# Patient Record
Sex: Male | Born: 1995 | Race: Black or African American | Hispanic: No | Marital: Single | Smoking: Never smoker
Health system: Southern US, Community
[De-identification: ages and names within clinical notes are randomized; demographics above are authoritative.]

## PROBLEM LIST (undated history)

## (undated) ENCOUNTER — Emergency Department (HOSPITAL_BASED_OUTPATIENT_CLINIC_OR_DEPARTMENT_OTHER): Admission: EM | Source: Home / Self Care

## (undated) HISTORY — PX: FACIAL FRACTURE SURGERY: SHX1570

---
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
## 2019-06-01 ENCOUNTER — Emergency Department (HOSPITAL_BASED_OUTPATIENT_CLINIC_OR_DEPARTMENT_OTHER)
Admission: EM | Admit: 2019-06-01 | Discharge: 2019-06-01 | Disposition: A | Discharge status: Home / Self Care | Payer: No Typology Code available for payment source | Attending: Emergency Medicine | Admitting: Emergency Medicine

## 2019-06-01 ENCOUNTER — Encounter (HOSPITAL_BASED_OUTPATIENT_CLINIC_OR_DEPARTMENT_OTHER): Payer: Self-pay | Admitting: *Deleted

## 2019-06-01 ENCOUNTER — Emergency Department (HOSPITAL_BASED_OUTPATIENT_CLINIC_OR_DEPARTMENT_OTHER): Payer: No Typology Code available for payment source

## 2019-06-01 DIAGNOSIS — M542 Cervicalgia: Secondary | ICD-10-CM | POA: Diagnosis not present

## 2019-06-01 DIAGNOSIS — M79606 Pain in leg, unspecified: Secondary | ICD-10-CM | POA: Insufficient documentation

## 2019-06-01 DIAGNOSIS — Y9389 Activity, other specified: Secondary | ICD-10-CM | POA: Diagnosis not present

## 2019-06-01 DIAGNOSIS — M25519 Pain in unspecified shoulder: Secondary | ICD-10-CM | POA: Insufficient documentation

## 2019-06-01 DIAGNOSIS — Y92410 Unspecified street and highway as the place of occurrence of the external cause: Secondary | ICD-10-CM | POA: Diagnosis not present

## 2019-06-01 DIAGNOSIS — S0990XA Unspecified injury of head, initial encounter: Secondary | ICD-10-CM | POA: Diagnosis present

## 2019-06-01 DIAGNOSIS — Y999 Unspecified external cause status: Secondary | ICD-10-CM | POA: Insufficient documentation

## 2019-06-01 MED ORDER — HYDROCODONE-ACETAMINOPHEN 5-325 MG PO TABS
1.0000 | ORAL_TABLET | Freq: Once | ORAL | Status: AC
  Administered 2019-06-01: 23:00:00 1 via ORAL
  Filled 2019-06-01: qty 1

## 2019-06-01 NOTE — Discharge Instructions (Addendum)
You were evaluated in the Emergency Department and after careful evaluation, we did not find any emergent condition requiring admission or further testing in the hospital.  Your exam/testing today is overall reassuring.  CT scans today were normal, no injuries.  Please return to the Emergency Department if you experience any worsening of your condition.  We encourage you to follow up with a primary care provider.  Thank you for allowing [place] to be a part of your care.
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
## 2019-09-06 ENCOUNTER — Emergency Department (HOSPITAL_COMMUNITY): Payer: No Typology Code available for payment source

## 2019-09-06 ENCOUNTER — Inpatient Hospital Stay (HOSPITAL_COMMUNITY)
Admission: EM | Admit: 2019-09-06 | Discharge: 2019-09-08 | DRG: 494 | Disposition: A | Discharge status: Home Health | Payer: No Typology Code available for payment source | Attending: Orthopaedic Surgery | Admitting: Orthopaedic Surgery

## 2019-09-06 ENCOUNTER — Other Ambulatory Visit: Payer: Self-pay

## 2019-09-06 DIAGNOSIS — S82892A Other fracture of left lower leg, initial encounter for closed fracture: Secondary | ICD-10-CM | POA: Diagnosis not present

## 2019-09-06 DIAGNOSIS — S9002XA Contusion of left ankle, initial encounter: Secondary | ICD-10-CM

## 2019-09-06 DIAGNOSIS — S82892B Other fracture of left lower leg, initial encounter for open fracture type I or II: Principal | ICD-10-CM | POA: Diagnosis present

## 2019-09-06 DIAGNOSIS — S91012A Laceration without foreign body, left ankle, initial encounter: Secondary | ICD-10-CM | POA: Diagnosis present

## 2019-09-06 DIAGNOSIS — M25372 Other instability, left ankle: Secondary | ICD-10-CM | POA: Diagnosis present

## 2019-09-06 DIAGNOSIS — S93432A Sprain of tibiofibular ligament of left ankle, initial encounter: Secondary | ICD-10-CM | POA: Diagnosis present

## 2019-09-06 DIAGNOSIS — S42115A Nondisplaced fracture of body of scapula, left shoulder, initial encounter for closed fracture: Secondary | ICD-10-CM | POA: Diagnosis present

## 2019-09-06 DIAGNOSIS — T300 Burn of unspecified body region, unspecified degree: Secondary | ICD-10-CM

## 2019-09-06 DIAGNOSIS — Z20822 Contact with and (suspected) exposure to covid-19: Secondary | ICD-10-CM | POA: Diagnosis present

## 2019-09-06 DIAGNOSIS — S9032XA Contusion of left foot, initial encounter: Secondary | ICD-10-CM | POA: Diagnosis present

## 2019-09-06 DIAGNOSIS — S40812A Abrasion of left upper arm, initial encounter: Secondary | ICD-10-CM | POA: Diagnosis present

## 2019-09-06 DIAGNOSIS — S8262XB Displaced fracture of lateral malleolus of left fibula, initial encounter for open fracture type I or II: Secondary | ICD-10-CM | POA: Diagnosis present

## 2019-09-06 DIAGNOSIS — R21 Rash and other nonspecific skin eruption: Secondary | ICD-10-CM | POA: Diagnosis present

## 2019-09-06 LAB — URINALYSIS, ROUTINE W REFLEX MICROSCOPIC
Bacteria, UA: NONE SEEN
Bilirubin Urine: NEGATIVE
Glucose, UA: NEGATIVE mg/dL
Ketones, ur: NEGATIVE mg/dL
Leukocytes,Ua: NEGATIVE
Nitrite: NEGATIVE
Protein, ur: NEGATIVE mg/dL
Specific Gravity, Urine: >1.046 — ABNORMAL HIGH (ref 1.005–1.030)
pH: 5 (ref 5.0–8.0)

## 2019-09-06 LAB — LACTIC ACID, PLASMA: Lactic Acid, Venous: 3.4 mmol/L (ref 0.5–1.9)

## 2019-09-06 LAB — I-STAT CHEM 8, ED
BUN: 16 mg/dL (ref 6–20)
Calcium, Ion: 1.07 mmol/L — ABNORMAL LOW (ref 1.15–1.40)
Chloride: 100 mmol/L (ref 98–111)
Creatinine, Ser: 1.1 mg/dL (ref 0.61–1.24)
Glucose, Bld: 115 mg/dL — ABNORMAL HIGH (ref 70–99)
HCT: 49 % (ref 39.0–52.0)
Hemoglobin: 16.7 g/dL (ref 13.0–17.0)
Potassium: 5.9 mmol/L — ABNORMAL HIGH (ref 3.5–5.1)
Sodium: 138 mmol/L (ref 135–145)
TCO2: 28 mmol/L (ref 22–32)

## 2019-09-06 LAB — BASIC METABOLIC PANEL
Anion gap: 12 (ref 5–15)
BUN: 12 mg/dL (ref 6–20)
CO2: 24 mmol/L (ref 22–32)
Calcium: 9.2 mg/dL (ref 8.9–10.3)
Chloride: 104 mmol/L (ref 98–111)
Creatinine, Ser: 1.04 mg/dL (ref 0.61–1.24)
GFR calc Af Amer: >60 mL/min (ref >60)
GFR calc non Af Amer: >60 mL/min (ref >60)
Glucose, Bld: 119 mg/dL — ABNORMAL HIGH (ref 70–99)
Potassium: 3.5 mmol/L (ref 3.5–5.1)
Sodium: 140 mmol/L (ref 135–145)

## 2019-09-06 LAB — PROTIME-INR
INR: 1 (ref 0.8–1.2)
Prothrombin Time: 12.8 seconds (ref 11.4–15.2)

## 2019-09-06 LAB — CBC
HCT: 44.9 % (ref 39.0–52.0)
Hemoglobin: 15.2 g/dL (ref 13.0–17.0)
MCH: 30 pg (ref 26.0–34.0)
MCHC: 33.9 g/dL (ref 30.0–36.0)
MCV: 88.7 fL (ref 80.0–100.0)
Platelets: 189 10*3/uL (ref 150–400)
RBC: 5.06 MIL/uL (ref 4.22–5.81)
RDW: 12.5 % (ref 11.5–15.5)
WBC: 18 10*3/uL — ABNORMAL HIGH (ref 4.0–10.5)
nRBC: 0 % (ref 0.0–0.2)

## 2019-09-06 LAB — ETHANOL: Alcohol, Ethyl (B): <10 mg/dL (ref <10)

## 2019-09-06 LAB — SAMPLE TO BLOOD BANK

## 2019-09-06 MED ORDER — TETANUS-DIPHTH-ACELL PERTUSSIS 5-2.5-18.5 LF-MCG/0.5 IM SUSP
0.5000 mL | Freq: Once | INTRAMUSCULAR | Status: AC
  Administered 2019-09-06: 0.5 mL via INTRAMUSCULAR
  Filled 2019-09-06: qty 0.5

## 2019-09-06 MED ORDER — SODIUM CHLORIDE 0.9 % IV BOLUS
1000.0000 mL | Freq: Once | INTRAVENOUS | Status: AC
  Administered 2019-09-06: 1000 mL via INTRAVENOUS

## 2019-09-06 MED ORDER — IOHEXOL 300 MG/ML  SOLN
100.0000 mL | Freq: Once | INTRAMUSCULAR | Status: AC | PRN
  Administered 2019-09-06: 100 mL via INTRAVENOUS

## 2019-09-06 MED ORDER — CEFAZOLIN SODIUM-DEXTROSE 2-4 GM/100ML-% IV SOLN
2.0000 g | Freq: Once | INTRAVENOUS | Status: AC
  Administered 2019-09-06: 2 g via INTRAVENOUS
  Filled 2019-09-06: qty 100

## 2019-09-06 MED ORDER — KETOROLAC TROMETHAMINE 15 MG/ML IJ SOLN
15.0000 mg | Freq: Once | INTRAMUSCULAR | Status: AC
  Administered 2019-09-06: 15 mg via INTRAVENOUS
  Filled 2019-09-06: qty 1

## 2019-09-06 MED ORDER — FENTANYL CITRATE (PF) 100 MCG/2ML IJ SOLN
50.0000 ug | INTRAMUSCULAR | Status: DC | PRN
  Administered 2019-09-06 – 2019-09-07 (×3): 50 ug via INTRAVENOUS
  Filled 2019-09-06 (×3): qty 2

## 2019-09-06 NOTE — Consult Note (Signed)
Responded to page, pt unavailable, no family present. Please page again if further need of chaplain services.   Rev. Donnel Saxon Chaplain
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
## 2019-09-06 NOTE — ED Notes (Signed)
Ankle laceration irrigated
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
## 2019-09-06 NOTE — ED Notes (Signed)
Wounds covered with bacitracin, nonadherent dressing, and curlex

## 2019-09-07 ENCOUNTER — Emergency Department (HOSPITAL_COMMUNITY): Payer: No Typology Code available for payment source | Admitting: Anesthesiology

## 2019-09-07 ENCOUNTER — Encounter (HOSPITAL_COMMUNITY)
Admission: EM | Disposition: A | Discharge status: Home Health | Payer: Self-pay | Source: Home / Self Care | Attending: Orthopaedic Surgery

## 2019-09-07 ENCOUNTER — Emergency Department (HOSPITAL_COMMUNITY): Payer: No Typology Code available for payment source

## 2019-09-07 DIAGNOSIS — S40812A Abrasion of left upper arm, initial encounter: Secondary | ICD-10-CM | POA: Diagnosis present

## 2019-09-07 DIAGNOSIS — R21 Rash and other nonspecific skin eruption: Secondary | ICD-10-CM | POA: Diagnosis present

## 2019-09-07 DIAGNOSIS — S82892B Other fracture of left lower leg, initial encounter for open fracture type I or II: Secondary | ICD-10-CM | POA: Diagnosis present

## 2019-09-07 DIAGNOSIS — M25372 Other instability, left ankle: Secondary | ICD-10-CM | POA: Diagnosis present

## 2019-09-07 DIAGNOSIS — S91012A Laceration without foreign body, left ankle, initial encounter: Secondary | ICD-10-CM | POA: Diagnosis present

## 2019-09-07 DIAGNOSIS — S9032XA Contusion of left foot, initial encounter: Secondary | ICD-10-CM | POA: Diagnosis present

## 2019-09-07 DIAGNOSIS — Z20822 Contact with and (suspected) exposure to covid-19: Secondary | ICD-10-CM | POA: Diagnosis present

## 2019-09-07 DIAGNOSIS — S93432A Sprain of tibiofibular ligament of left ankle, initial encounter: Secondary | ICD-10-CM | POA: Diagnosis present

## 2019-09-07 DIAGNOSIS — S42115A Nondisplaced fracture of body of scapula, left shoulder, initial encounter for closed fracture: Secondary | ICD-10-CM | POA: Diagnosis present

## 2019-09-07 DIAGNOSIS — S8262XB Displaced fracture of lateral malleolus of left fibula, initial encounter for open fracture type I or II: Secondary | ICD-10-CM | POA: Diagnosis present

## 2019-09-07 DIAGNOSIS — S82892A Other fracture of left lower leg, initial encounter for closed fracture: Secondary | ICD-10-CM | POA: Diagnosis present

## 2019-09-07 HISTORY — PX: I & D EXTREMITY: SHX5045

## 2019-09-07 LAB — SARS CORONAVIRUS 2 BY RT PCR (HOSPITAL ORDER, PERFORMED IN ~~LOC~~ HOSPITAL LAB): SARS Coronavirus 2: NEGATIVE

## 2019-09-07 LAB — GLUCOSE, CAPILLARY: Glucose-Capillary: 134 mg/dL — ABNORMAL HIGH (ref 70–99)

## 2019-09-07 SURGERY — IRRIGATION AND DEBRIDEMENT EXTREMITY
Anesthesia: General | Site: Ankle | Laterality: Left

## 2019-09-07 MED ORDER — ONDANSETRON HCL 4 MG/2ML IJ SOLN
INTRAMUSCULAR | Status: AC
  Filled 2019-09-07: qty 2

## 2019-09-07 MED ORDER — VANCOMYCIN HCL 1000 MG IV SOLR
INTRAVENOUS | Status: AC
  Filled 2019-09-07: qty 1000

## 2019-09-07 MED ORDER — CEFAZOLIN SODIUM 1 G IJ SOLR
INTRAMUSCULAR | Status: AC
  Filled 2019-09-07: qty 20

## 2019-09-07 MED ORDER — LACTATED RINGERS IV SOLN: INTRAVENOUS | Status: DC | PRN

## 2019-09-07 MED ORDER — ONDANSETRON HCL 4 MG/2ML IJ SOLN
INTRAMUSCULAR | Status: DC | PRN
  Administered 2019-09-07: 4 mg via INTRAVENOUS

## 2019-09-07 MED ORDER — METOCLOPRAMIDE HCL 5 MG/ML IJ SOLN
10.0000 mg | Freq: Once | INTRAMUSCULAR | Status: AC
  Administered 2019-09-07: 10 mg via INTRAVENOUS
  Filled 2019-09-07: qty 2

## 2019-09-07 MED ORDER — ACETAMINOPHEN 10 MG/ML IV SOLN: 1000.0000 mg | Freq: Once | INTRAVENOUS | Status: DC | PRN

## 2019-09-07 MED ORDER — CEFAZOLIN SODIUM-DEXTROSE 2-4 GM/100ML-% IV SOLN
2.0000 g | Freq: Three times a day (TID) | INTRAVENOUS | Status: AC
  Administered 2019-09-07 – 2019-09-08 (×3): 2 g via INTRAVENOUS
  Filled 2019-09-07 (×3): qty 100

## 2019-09-07 MED ORDER — OXYCODONE HCL 5 MG PO TABS
10.0000 mg | ORAL_TABLET | ORAL | Status: DC | PRN
  Administered 2019-09-07: 10 mg via ORAL
  Administered 2019-09-08 (×3): 15 mg via ORAL
  Administered 2019-09-08: 10 mg via ORAL
  Filled 2019-09-07: qty 2
  Filled 2019-09-07: qty 3
  Filled 2019-09-07 (×2): qty 2
  Filled 2019-09-07: qty 3

## 2019-09-07 MED ORDER — SUCCINYLCHOLINE 20MG/ML (10ML) SYRINGE FOR MEDFUSION PUMP - OPTIME
INTRAMUSCULAR | Status: DC | PRN
  Administered 2019-09-07: 120 mg via INTRAVENOUS

## 2019-09-07 MED ORDER — OXYCODONE HCL 5 MG PO TABS
ORAL_TABLET | ORAL | Status: AC
  Administered 2019-09-07: 5 mg via ORAL
  Filled 2019-09-07: qty 1

## 2019-09-07 MED ORDER — METHOCARBAMOL 500 MG PO TABS
ORAL_TABLET | ORAL | Status: AC
  Administered 2019-09-07: 500 mg via ORAL
  Filled 2019-09-07: qty 1

## 2019-09-07 MED ORDER — SUCCINYLCHOLINE CHLORIDE 200 MG/10ML IV SOSY
PREFILLED_SYRINGE | INTRAVENOUS | Status: AC
  Filled 2019-09-07: qty 10

## 2019-09-07 MED ORDER — TOBRAMYCIN SULFATE 1.2 G IJ SOLR
INTRAMUSCULAR | Status: DC | PRN
  Administered 2019-09-07: 1.2 g

## 2019-09-07 MED ORDER — PROPOFOL 10 MG/ML IV BOLUS
INTRAVENOUS | Status: AC
  Filled 2019-09-07: qty 20

## 2019-09-07 MED ORDER — DEXAMETHASONE SODIUM PHOSPHATE 10 MG/ML IJ SOLN
INTRAMUSCULAR | Status: DC | PRN
  Administered 2019-09-07: 10 mg via INTRAVENOUS

## 2019-09-07 MED ORDER — ZOLPIDEM TARTRATE 5 MG PO TABS: 5.0000 mg | ORAL_TABLET | Freq: Every evening | ORAL | Status: DC | PRN

## 2019-09-07 MED ORDER — SODIUM CHLORIDE 0.9 % IR SOLN
Status: DC | PRN
  Administered 2019-09-07 (×2): 3000 mL

## 2019-09-07 MED ORDER — NAPROXEN 250 MG PO TABS
250.0000 mg | ORAL_TABLET | Freq: Two times a day (BID) | ORAL | Status: DC
  Administered 2019-09-07 – 2019-09-08 (×2): 250 mg via ORAL
  Filled 2019-09-07 (×2): qty 1

## 2019-09-07 MED ORDER — MIDAZOLAM HCL 2 MG/2ML IJ SOLN
INTRAMUSCULAR | Status: DC | PRN
  Administered 2019-09-07: 2 mg via INTRAVENOUS

## 2019-09-07 MED ORDER — ONDANSETRON HCL 4 MG/2ML IJ SOLN
4.0000 mg | Freq: Once | INTRAMUSCULAR | Status: AC
  Administered 2019-09-07: 4 mg via INTRAVENOUS

## 2019-09-07 MED ORDER — PROPOFOL 10 MG/ML IV BOLUS
INTRAVENOUS | Status: DC | PRN
  Administered 2019-09-07: 30 mg via INTRAVENOUS
  Administered 2019-09-07: 200 mg via INTRAVENOUS

## 2019-09-07 MED ORDER — BISACODYL 5 MG PO TBEC: 5.0000 mg | DELAYED_RELEASE_TABLET | Freq: Every day | ORAL | Status: DC | PRN

## 2019-09-07 MED ORDER — HYDROMORPHONE HCL 1 MG/ML IJ SOLN
1.0000 mg | Freq: Once | INTRAMUSCULAR | Status: AC
  Administered 2019-09-07: 1 mg via INTRAVENOUS
  Filled 2019-09-07: qty 1

## 2019-09-07 MED ORDER — MAGNESIUM CITRATE PO SOLN: 1.0000 | Freq: Once | ORAL | Status: DC | PRN

## 2019-09-07 MED ORDER — VANCOMYCIN HCL 1000 MG IV SOLR
INTRAVENOUS | Status: DC | PRN
  Administered 2019-09-07: 1000 mg

## 2019-09-07 MED ORDER — SUGAMMADEX SODIUM 200 MG/2ML IV SOLN
INTRAVENOUS | Status: DC | PRN
  Administered 2019-09-07: 140.6 mg via INTRAVENOUS

## 2019-09-07 MED ORDER — POLYETHYLENE GLYCOL 3350 17 G PO PACK: 17.0000 g | PACK | Freq: Every day | ORAL | Status: DC | PRN

## 2019-09-07 MED ORDER — MIDAZOLAM HCL 2 MG/2ML IJ SOLN
INTRAMUSCULAR | Status: AC
  Filled 2019-09-07: qty 2

## 2019-09-07 MED ORDER — TOBRAMYCIN SULFATE 1.2 G IJ SOLR
INTRAMUSCULAR | Status: AC
  Filled 2019-09-07: qty 1.2

## 2019-09-07 MED ORDER — DEXMEDETOMIDINE HCL 200 MCG/2ML IV SOLN
INTRAVENOUS | Status: DC | PRN
  Administered 2019-09-07 (×2): 4 ug via INTRAVENOUS

## 2019-09-07 MED ORDER — HYDROMORPHONE HCL 1 MG/ML IJ SOLN
0.5000 mg | INTRAMUSCULAR | Status: DC | PRN
  Administered 2019-09-07 – 2019-09-08 (×3): 1 mg via INTRAVENOUS
  Filled 2019-09-07 (×3): qty 1

## 2019-09-07 MED ORDER — ACETAMINOPHEN 500 MG PO TABS
1000.0000 mg | ORAL_TABLET | Freq: Three times a day (TID) | ORAL | Status: DC
  Administered 2019-09-07 – 2019-09-08 (×4): 1000 mg via ORAL
  Filled 2019-09-07 (×4): qty 2

## 2019-09-07 MED ORDER — OXYCODONE HCL 5 MG PO TABS: 5.0000 mg | ORAL_TABLET | Freq: Once | ORAL | Status: AC | PRN

## 2019-09-07 MED ORDER — OXYCODONE HCL 5 MG PO TABS
5.0000 mg | ORAL_TABLET | ORAL | Status: DC | PRN
  Administered 2019-09-07: 10 mg via ORAL
  Filled 2019-09-07: qty 2

## 2019-09-07 MED ORDER — DIPHENHYDRAMINE HCL 12.5 MG/5ML PO ELIX
12.5000 mg | ORAL_SOLUTION | ORAL | Status: DC | PRN
  Administered 2019-09-07: 25 mg via ORAL
  Filled 2019-09-07: qty 10

## 2019-09-07 MED ORDER — DOCUSATE SODIUM 100 MG PO CAPS
100.0000 mg | ORAL_CAPSULE | Freq: Two times a day (BID) | ORAL | Status: DC
  Administered 2019-09-07 – 2019-09-08 (×2): 100 mg via ORAL
  Filled 2019-09-07 (×2): qty 1

## 2019-09-07 MED ORDER — ENOXAPARIN SODIUM 40 MG/0.4ML ~~LOC~~ SOLN
40.0000 mg | SUBCUTANEOUS | Status: DC
  Administered 2019-09-08: 40 mg via SUBCUTANEOUS
  Filled 2019-09-07: qty 0.4

## 2019-09-07 MED ORDER — METOCLOPRAMIDE HCL 5 MG/ML IJ SOLN
5.0000 mg | Freq: Three times a day (TID) | INTRAMUSCULAR | Status: DC | PRN
  Administered 2019-09-07: 10 mg via INTRAVENOUS
  Filled 2019-09-07: qty 2

## 2019-09-07 MED ORDER — METOCLOPRAMIDE HCL 5 MG PO TABS: 5.0000 mg | ORAL_TABLET | Freq: Three times a day (TID) | ORAL | Status: DC | PRN

## 2019-09-07 MED ORDER — HYDROMORPHONE HCL 1 MG/ML IJ SOLN
INTRAMUSCULAR | Status: AC
  Administered 2019-09-07: 0.5 mg via INTRAVENOUS
  Filled 2019-09-07: qty 1

## 2019-09-07 MED ORDER — FENTANYL CITRATE (PF) 250 MCG/5ML IJ SOLN
INTRAMUSCULAR | Status: AC
  Filled 2019-09-07: qty 5

## 2019-09-07 MED ORDER — HYDROCODONE-ACETAMINOPHEN 5-325 MG PO TABS: 1.0000 | ORAL_TABLET | ORAL | 0 refills | Status: DC | PRN

## 2019-09-07 MED ORDER — ONDANSETRON HCL 4 MG/2ML IJ SOLN
4.0000 mg | Freq: Four times a day (QID) | INTRAMUSCULAR | Status: DC | PRN
  Administered 2019-09-07 (×2): 4 mg via INTRAVENOUS
  Filled 2019-09-07 (×2): qty 2

## 2019-09-07 MED ORDER — OXYCODONE HCL 5 MG/5ML PO SOLN: 5.0000 mg | Freq: Once | ORAL | Status: AC | PRN

## 2019-09-07 MED ORDER — METHOCARBAMOL 500 MG PO TABS
500.0000 mg | ORAL_TABLET | Freq: Four times a day (QID) | ORAL | Status: DC | PRN
  Administered 2019-09-07 – 2019-09-08 (×3): 500 mg via ORAL
  Filled 2019-09-07 (×3): qty 1

## 2019-09-07 MED ORDER — 0.9 % SODIUM CHLORIDE (POUR BTL) OPTIME
TOPICAL | Status: DC | PRN
  Administered 2019-09-07: 1000 mL

## 2019-09-07 MED ORDER — LIDOCAINE 2% (20 MG/ML) 5 ML SYRINGE
INTRAMUSCULAR | Status: AC
  Filled 2019-09-07: qty 5

## 2019-09-07 MED ORDER — SODIUM CHLORIDE 0.9 % IV BOLUS
1000.0000 mL | Freq: Once | INTRAVENOUS | Status: AC
  Administered 2019-09-07: 1000 mL via INTRAVENOUS

## 2019-09-07 MED ORDER — FENTANYL CITRATE (PF) 250 MCG/5ML IJ SOLN
INTRAMUSCULAR | Status: DC | PRN
  Administered 2019-09-07: 50 ug via INTRAVENOUS
  Administered 2019-09-07: 100 ug via INTRAVENOUS

## 2019-09-07 MED ORDER — LIDOCAINE HCL (CARDIAC) PF 100 MG/5ML IV SOSY
PREFILLED_SYRINGE | INTRAVENOUS | Status: DC | PRN
  Administered 2019-09-07: 60 mg via INTRAVENOUS

## 2019-09-07 MED ORDER — DEXAMETHASONE SODIUM PHOSPHATE 10 MG/ML IJ SOLN
INTRAMUSCULAR | Status: AC
  Filled 2019-09-07: qty 1

## 2019-09-07 MED ORDER — PROMETHAZINE HCL 25 MG/ML IJ SOLN: 6.2500 mg | INTRAMUSCULAR | Status: DC | PRN

## 2019-09-07 MED ORDER — CEFAZOLIN SODIUM-DEXTROSE 2-3 GM-%(50ML) IV SOLR
INTRAVENOUS | Status: DC | PRN
Start: 2019-09-07 — End: 2019-09-07
  Administered 2019-09-07: 2 g via INTRAVENOUS

## 2019-09-07 MED ORDER — METHOCARBAMOL 1000 MG/10ML IJ SOLN
500.0000 mg | Freq: Four times a day (QID) | INTRAVENOUS | Status: DC | PRN
  Filled 2019-09-07: qty 5

## 2019-09-07 MED ORDER — ONDANSETRON HCL 4 MG/2ML IJ SOLN
INTRAMUSCULAR | Status: AC
  Administered 2019-09-07: 4 mg via INTRAVENOUS
  Filled 2019-09-07: qty 2

## 2019-09-07 MED ORDER — ROCURONIUM 10MG/ML (10ML) SYRINGE FOR MEDFUSION PUMP - OPTIME
INTRAVENOUS | Status: DC | PRN
  Administered 2019-09-07: 10 mg via INTRAVENOUS

## 2019-09-07 MED ORDER — ROCURONIUM BROMIDE 10 MG/ML (PF) SYRINGE
PREFILLED_SYRINGE | INTRAVENOUS | Status: AC
  Filled 2019-09-07: qty 10

## 2019-09-07 MED ORDER — HYDROMORPHONE HCL 1 MG/ML IJ SOLN
0.2500 mg | INTRAMUSCULAR | Status: DC | PRN
  Administered 2019-09-07: 0.5 mg via INTRAVENOUS

## 2019-09-07 MED ORDER — ONDANSETRON HCL 4 MG PO TABS: 4.0000 mg | ORAL_TABLET | Freq: Four times a day (QID) | ORAL | Status: DC | PRN

## 2019-09-07 SURGICAL SUPPLY — 60 items
BNDG COHESIVE 4X5 TAN STRL (GAUZE/BANDAGES/DRESSINGS) ×3 IMPLANT
BNDG ELASTIC 4X5.8 VLCR STR LF (GAUZE/BANDAGES/DRESSINGS) ×3 IMPLANT
BNDG ELASTIC 6X10 VLCR STRL LF (GAUZE/BANDAGES/DRESSINGS) ×3 IMPLANT
BNDG ELASTIC 6X5.8 VLCR STR LF (GAUZE/BANDAGES/DRESSINGS) ×3 IMPLANT
BNDG GAUZE ELAST 4 BULKY (GAUZE/BANDAGES/DRESSINGS) ×3 IMPLANT
CNTNR URN SCR LID CUP LEK RST (MISCELLANEOUS) IMPLANT
CONT SPEC 4OZ STRL OR WHT (MISCELLANEOUS)
COVER SURGICAL LIGHT HANDLE (MISCELLANEOUS) ×3 IMPLANT
COVER WAND RF STERILE (DRAPES) IMPLANT
CUFF TOURN SGL LL 12 NO SLV (MISCELLANEOUS) IMPLANT
CUFF TOURN SGL QUICK 34 (TOURNIQUET CUFF) ×2
CUFF TRNQT CYL 34X4.125X (TOURNIQUET CUFF) ×1 IMPLANT
DEVICE FIXATION SYNDESMOSIS (Bone Implant) ×3 IMPLANT
DRAPE U-SHAPE 47X51 STRL (DRAPES) ×3 IMPLANT
DRSG PAD ABDOMINAL 8X10 ST (GAUZE/BANDAGES/DRESSINGS) ×9 IMPLANT
DURAPREP 26ML APPLICATOR (WOUND CARE) ×3 IMPLANT
ELECT REM PT RETURN 9FT ADLT (ELECTROSURGICAL) ×3
ELECTRODE REM PT RTRN 9FT ADLT (ELECTROSURGICAL) ×1 IMPLANT
FIXATION ZIPTIGHT ANKLE SNDSMS (Ankle) ×1 IMPLANT
GAUZE SPONGE 4X4 12PLY STRL (GAUZE/BANDAGES/DRESSINGS) ×9 IMPLANT
GAUZE XEROFORM 1X8 LF (GAUZE/BANDAGES/DRESSINGS) ×3 IMPLANT
GAUZE XEROFORM 5X9 LF (GAUZE/BANDAGES/DRESSINGS) ×9 IMPLANT
GLOVE BIO SURGEON STRL SZ 6.5 (GLOVE) ×2 IMPLANT
GLOVE BIO SURGEONS STRL SZ 6.5 (GLOVE) ×1
GLOVE BIOGEL PI IND STRL 8 (GLOVE) ×1 IMPLANT
GLOVE BIOGEL PI IND STRL 9 (GLOVE) ×1 IMPLANT
GLOVE BIOGEL PI INDICATOR 8 (GLOVE) ×2
GLOVE BIOGEL PI INDICATOR 9 (GLOVE) ×2
GLOVE ECLIPSE 8.0 STRL XLNG CF (GLOVE) ×3 IMPLANT
GLOVE INDICATOR 6.5 STRL GRN (GLOVE) ×3 IMPLANT
GOWN STRL REUS W/ TWL LRG LVL3 (GOWN DISPOSABLE) ×1 IMPLANT
GOWN STRL REUS W/ TWL XL LVL3 (GOWN DISPOSABLE) ×2 IMPLANT
GOWN STRL REUS W/TWL LRG LVL3 (GOWN DISPOSABLE) ×2
GOWN STRL REUS W/TWL XL LVL3 (GOWN DISPOSABLE) ×4
HANDPIECE INTERPULSE COAX TIP (DISPOSABLE) ×2
K-WIRE ACE 1.6X6 (WIRE) ×3
KIT BASIN OR (CUSTOM PROCEDURE TRAY) ×3 IMPLANT
KIT TURNOVER KIT B (KITS) ×3 IMPLANT
KWIRE ACE 1.6X6 (WIRE) ×1 IMPLANT
MANIFOLD NEPTUNE II (INSTRUMENTS) ×3 IMPLANT
NS IRRIG 1000ML POUR BTL (IV SOLUTION) ×3 IMPLANT
PACK ORTHO EXTREMITY (CUSTOM PROCEDURE TRAY) ×3 IMPLANT
PAD ARMBOARD 7.5X6 YLW CONV (MISCELLANEOUS) ×6 IMPLANT
PAD CAST 4YDX4 CTTN HI CHSV (CAST SUPPLIES) ×2 IMPLANT
PADDING CAST COTTON 4X4 STRL (CAST SUPPLIES) ×4
PADDING CAST COTTON 6X4 STRL (CAST SUPPLIES) ×9 IMPLANT
PLATE ACE 3.5MM 2HOLE (Plate) ×3 IMPLANT
SET HNDPC FAN SPRY TIP SCT (DISPOSABLE) ×1 IMPLANT
SPONGE LAP 18X18 RF (DISPOSABLE) ×3 IMPLANT
SUT ETHILON 2 0 FS 18 (SUTURE) ×3 IMPLANT
SUT ETHILON 2 0 PSLX (SUTURE) ×3 IMPLANT
SUT ETHILON 2 LR (SUTURE) ×3 IMPLANT
SWAB CULTURE ESWAB REG 1ML (MISCELLANEOUS) IMPLANT
TOWEL GREEN STERILE (TOWEL DISPOSABLE) ×3 IMPLANT
TOWEL GREEN STERILE FF (TOWEL DISPOSABLE) ×3 IMPLANT
TUBE CONNECTING 12'X1/4 (SUCTIONS) ×1
TUBE CONNECTING 12X1/4 (SUCTIONS) ×2 IMPLANT
UNDERPAD 30X36 HEAVY ABSORB (UNDERPADS AND DIAPERS) ×3 IMPLANT
YANKAUER SUCT BULB TIP NO VENT (SUCTIONS) ×3 IMPLANT
ZIPTIGHT ANKLE SYNODESMOSS FIX (Ankle) ×3 IMPLANT
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
## 2019-09-07 NOTE — Interval H&P Note (Signed)
History and Physical Interval Note:  09/07/2019 5:52 AM  Kevin Villarreal  has presented today for surgery, with the diagnosis of Left Ankle Fracture.  The various methods of treatment have been discussed with the patient and family. After consideration of risks, benefits and other options for treatment, the patient has consented to  Procedure(s): IRRIGATION AND DEBRIDEMENT EXTREMITY, POSS. ORIF FIBULA (Left) as a surgical intervention.  The patient's history has been reviewed, patient examined, no change in status, stable for surgery.  I have reviewed the patient's chart and labs.  Questions were answered to the patient's satisfaction.     Bjorn Pippin

## 2019-09-07 NOTE — Anesthesia Postprocedure Evaluation (Signed)
Anesthesia Post Note  Patient: Kevin Villarreal  Procedure(s) Performed: IRRIGATION AND DEBRIDEMENT EXTREMITY, POSS. ORIF FIBULA (Left Ankle)     Patient location during evaluation: PACU Anesthesia Type: General Level of consciousness: awake and alert Pain management: pain level controlled Vital Signs Assessment: post-procedure vital signs reviewed and stable Respiratory status: spontaneous breathing, nonlabored ventilation, respiratory function stable and patient connected to nasal cannula oxygen Cardiovascular status: blood pressure returned to baseline and stable Postop Assessment: no apparent nausea or vomiting Anesthetic complications: no   No complications documented.  Last Vitals:  Vitals:   09/07/19 0810 09/07/19 0905  BP: 127/79 130/83  Pulse: 83 94  Resp: 11 16  Temp: 36.6 C 36.6 C  SpO2: 99% 98%    Last Pain:  Vitals:   09/07/19 0905  TempSrc: Oral  PainSc:                  Kennieth Rad

## 2019-09-07 NOTE — Progress Notes (Signed)
PT Cancellation Note  Patient Details Name: Kevin Villarreal MRN: [id number] DOB: 1995-09-01   Cancelled Treatment:    Reason Eval/Treat Not Completed: Other (comment). PT attempted x3, pt continues to be limited by sig nausea today. PT will continue to follow and evaluate as pt can tolerate.   Rolm Baptise, PT, DPT   Acute Rehabilitation Department Pager #: [phone]   Gaetana Michaelis 09/07/2019, 4:25 PM

## 2019-09-07 NOTE — Transfer of Care (Signed)
Immediate Anesthesia Transfer of Care Note  Patient: Kevin Villarreal  Procedure(s) Performed: IRRIGATION AND DEBRIDEMENT EXTREMITY, POSS. ORIF FIBULA (Left Ankle)  Patient Location: PACU  Anesthesia Type:General  Level of Consciousness: awake, alert  and oriented  Airway & Oxygen Therapy: Patient Spontanous Breathing and Patient connected to nasal cannula oxygen  Post-op Assessment: Report given to RN, Post -op Vital signs reviewed and stable and Patient moving all extremities X 4  Post vital signs: Reviewed and stable  Last Vitals:  Vitals Value Taken Time  BP 123/73 09/07/19 0726  Temp    Pulse 95 09/07/19 0729  Resp 13 09/07/19 0729  SpO2 100 % 09/07/19 0729  Vitals shown include unvalidated device data.  Last Pain:  Vitals:   09/07/19 0326  TempSrc:   PainSc: Asleep         Complications: No complications documented.
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
## 2019-09-07 NOTE — Anesthesia Preprocedure Evaluation (Addendum)
Anesthesia Evaluation  Patient identified by MRN, date of birth, ID band Patient awake    Reviewed: Allergy & Precautions, NPO status , Patient's Chart, lab work & pertinent test results  Airway Mallampati: I  TM Distance: >3 FB Neck ROM: Full    Dental no notable dental hx.    Pulmonary neg pulmonary ROS,    Pulmonary exam normal breath sounds clear to auscultation       Cardiovascular negative cardio ROS Normal cardiovascular exam Rhythm:Regular Rate:Normal     Neuro/Psych negative neurological ROS  negative psych ROS   GI/Hepatic negative GI ROS, (+)     substance abuse  ,   Endo/Other  negative endocrine ROS  Renal/GU negative Renal ROS     Musculoskeletal negative musculoskeletal ROS (+)   Abdominal   Peds negative pediatric ROS (+)  Hematology negative hematology ROS (+)   Anesthesia Other Findings Left Ankle Fracture  Reproductive/Obstetrics                            Anesthesia Physical Anesthesia Plan  ASA: I and emergent  Anesthesia Plan: General   Post-op Pain Management:    Induction: Intravenous  PONV Risk Score and Plan: 2 and Ondansetron, Dexamethasone, Midazolam and Treatment may vary due to age or medical condition  Airway Management Planned: Oral ETT  Additional Equipment:   Intra-op Plan:   Post-operative Plan: Extubation in OR  Informed Consent: I have reviewed the patients History and Physical, chart, labs and discussed the procedure including the risks, benefits and alternatives for the proposed anesthesia with the patient or authorized representative who has indicated his/her understanding and acceptance.     Dental advisory given  Plan Discussed with: CRNA  Anesthesia Plan Comments:        Anesthesia Quick Evaluation

## 2019-09-07 NOTE — Anesthesia Procedure Notes (Signed)
Procedure Name: Intubation Date/Time: 09/07/2019 6:04 AM Performed by: Molli Hazard, CRNA Pre-anesthesia Checklist: Patient identified, Emergency Drugs available, Suction available and Patient being monitored Patient Re-evaluated:Patient Re-evaluated prior to induction Oxygen Delivery Method: Circle system utilized Preoxygenation: Pre-oxygenation with 100% oxygen Induction Type: IV induction and Rapid sequence Laryngoscope Size: Miller and 2 Grade View: Grade I Tube type: Oral Tube size: 7.5 mm Number of attempts: 1 Airway Equipment and Method: Stylet Placement Confirmation: ETT inserted through vocal cords under direct vision,  positive ETCO2 and breath sounds checked- equal and bilateral Secured at: 23 cm Tube secured with: Tape Dental Injury: Teeth and Oropharynx as per pre-operative assessment

## 2019-09-07 NOTE — Progress Notes (Signed)
Orthopedic Tech Progress Note Patient Details:  Kevin Villarreal 08/21/1995 [id number]  Ortho Devices Type of Ortho Device: Arm sling, CAM walker Ortho Device/Splint Location: lle Ortho Device/Splint Interventions: Ordered, Application, Adjustment   Post Interventions Patient Tolerated: Well Instructions Provided: Care of device, Adjustment of device   Trinna Post 09/07/2019, 12:14 AM

## 2019-09-08 ENCOUNTER — Encounter (HOSPITAL_COMMUNITY): Payer: Self-pay | Admitting: Orthopaedic Surgery

## 2019-09-08 LAB — BASIC METABOLIC PANEL
Anion gap: 12 (ref 5–15)
BUN: 6 mg/dL (ref 6–20)
CO2: 26 mmol/L (ref 22–32)
Calcium: 9.1 mg/dL (ref 8.9–10.3)
Chloride: 99 mmol/L (ref 98–111)
Creatinine, Ser: 0.87 mg/dL (ref 0.61–1.24)
GFR calc Af Amer: >60 mL/min (ref >60)
GFR calc non Af Amer: >60 mL/min (ref >60)
Glucose, Bld: 100 mg/dL — ABNORMAL HIGH (ref 70–99)
Potassium: 3.6 mmol/L (ref 3.5–5.1)
Sodium: 137 mmol/L (ref 135–145)

## 2019-09-08 LAB — CBC
HCT: 41.2 % (ref 39.0–52.0)
Hemoglobin: 13.9 g/dL (ref 13.0–17.0)
MCH: 29.8 pg (ref 26.0–34.0)
MCHC: 33.7 g/dL (ref 30.0–36.0)
MCV: 88.2 fL (ref 80.0–100.0)
Platelets: 164 10*3/uL (ref 150–400)
RBC: 4.67 MIL/uL (ref 4.22–5.81)
RDW: 12.5 % (ref 11.5–15.5)
WBC: 11.2 10*3/uL — ABNORMAL HIGH (ref 4.0–10.5)
nRBC: 0 % (ref 0.0–0.2)

## 2019-09-08 MED ORDER — ASPIRIN 81 MG PO CHEW
81.0000 mg | CHEWABLE_TABLET | Freq: Two times a day (BID) | ORAL | 0 refills | Status: AC
Start: 2019-09-08 — End: 2019-10-20

## 2019-09-08 MED ORDER — OXYCODONE HCL 5 MG PO TABS: ORAL_TABLET | ORAL | 0 refills | Status: AC

## 2019-09-08 MED ORDER — ONDANSETRON HCL 4 MG PO TABS: 4.0000 mg | ORAL_TABLET | Freq: Three times a day (TID) | ORAL | 1 refills | Status: AC | PRN

## 2019-09-08 MED ORDER — ACETAMINOPHEN 500 MG PO TABS
1000.0000 mg | ORAL_TABLET | Freq: Three times a day (TID) | ORAL | 0 refills | Status: AC
Start: 2019-09-08 — End: 2019-09-22

## 2019-09-08 MED ORDER — MELOXICAM 7.5 MG PO TABS
7.5000 mg | ORAL_TABLET | Freq: Every day | ORAL | 0 refills | Status: AC
Start: 2019-09-08 — End: 2019-10-08

## 2019-09-08 NOTE — Progress Notes (Signed)
Inpatient Rehabilitation Admissions Coordinator  Inpatient rehab consult received,. I met with patient and with his Mom at bedside. Pt not in need of a CIR admit at this time. He plans to go home with his Mom initially. We will sign off at this time.  Danne Baxter, RN, MSN Rehab Admissions Coordinator [phone] 09/08/2019 11:03 AM

## 2019-09-08 NOTE — Plan of Care (Signed)
  Problem: Education: Goal: Knowledge of General Education information will improve Description: Including pain rating scale, medication(s)/side effects and non-pharmacologic comfort measures Outcome: Progressing   Problem: Pain Managment: Goal: General experience of comfort will improve Outcome: Progressing   Problem: Safety: Goal: Ability to remain free from injury will improve Outcome: Progressing   
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
## 2019-09-08 NOTE — Progress Notes (Signed)
LLE dressing completed per Covering Provider C. McBane  Instruction. LUE dressing reinforced.   Patient continues to remove LUE sling. Educated and patient verbalized understanding.  Patient states, relief and dressing comfortable at this time.

## 2019-09-08 NOTE — Progress Notes (Signed)
Patient with continued discomfort and c/o of burning and itching  of LLE. Patient has been medicated for pain multiple times during shift. LLE repositioned and ice applied.  Pt believes the source of continued discomfort is coming from the LLE dressing.  Call out to covering Provider C. McBane. At 12:58am. Call back at 01:07am, instructions given to redress LLE dressing.
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
## 2019-09-08 NOTE — Discharge Instructions (Signed)
Ramond Marrow MD, MPH Alfonse Alpers, PA-C [facility] [address] [phone] (tel)   [phone] (fax)  General Discharge Instructions  WEIGHT BEARING STATUS: - Toe touch weight bearing left lower extremity with CAM boot - Weight bearing as tolerated left upper extremity  RANGE OF MOTION/ACTIVITY: - Range of motion as tolerated left upper extremity - Left lower extremity: per physical therapist  Wound Care: Do NOT apply any ointments, solutions or lotions to pin sites or surgical wounds.  These prevent needed drainage and even though solutions like hydrogen peroxide kill bacteria, they also damage cells lining the pin sites that help fight infection.  Applying lotions or ointments can keep the wounds moist and can cause them to breakdown and open up as well. This can increase the risk for infection. When in doubt call the office.  Surgical incisions should be dressed daily.  . If any drainage is noted, use one layer of xeroform or petroleum gauze, non adhesive dressing, gauze or ABD, ace wrap . Wash over with gentle soap and water when you shower/bathe  . You may shower on Post-Op Day #3. Gently pat the area dry.  . Do not soak the shoulder in water or submerge it. Keep dry incisions as dry as possible. . Do not go swimming in the pool or ocean until 4 weeks after surgery or when otherwise instructed.    DVT/PE prophylaxis: Aspirin - twice daily  Diet: as you were eating previously.  Can use over the counter stool softeners and bowel preparations, such as Miralax, to help with bowel movements.  Narcotics can be constipating.  Be sure to drink plenty of fluids  POST-OP MEDICATIONS- Multimodal approach to pain control . In general your pain will be controlled with a combination of substances.  Prescriptions unless otherwise discussed are electronically sent to your pharmacy.  This is a carefully made plan we use to minimize narcotic use.     ? Meloxicam OR Celebrex - Anti-inflammatory medication taken on a scheduled basis ? Acetaminophen - Non-narcotic pain medicine taken on a scheduled basis  ? Oxycodone - This is a strong narcotic, to be used only on an "as needed" basis for pain. ? Aspirin 81mg  - This medicine is used to minimize the risk of blood clots after surgery. ? Zofran - take as needed for nausea  *You should wean off your narcotic medicines as soon as you are able.  Most patients will be off or using minimal narcotics before their first postop appointment.*  IF YOU ARE ON NARCOTIC MEDICATIONS IT IS NOT PERMISSIBLE TO OPERATE A MOTOR VEHICLE (MOTORCYCLE/CAR/TRUCK/MOPED) OR HEAVY MACHINERY DO NOT MIX NARCOTICS WITH OTHER CNS (CENTRAL NERVOUS SYSTEM) DEPRESSANTS SUCH AS ALCOHOL  STOP SMOKING OR USING NICOTINE PRODUCTS!!!!  As discussed nicotine severely impairs your body's ability to heal surgical and traumatic wounds but also impairs bone healing.  Wounds and bone heal by forming microscopic blood vessels (angiogenesis) and nicotine is a vasoconstrictor (essentially, shrinks blood vessels).  Therefore, if vasoconstriction occurs to these microscopic blood vessels they essentially disappear and are unable to deliver necessary nutrients to the healing tissue.  This is one modifiable factor that you can do to dramatically increase your chances of healing your injury.    (This means no smoking, no nicotine gum, patches, etc)       ICE AND ELEVATE INJURED/OPERATIVE EXTREMITY  Using ice and elevating the injured extremity above your heart can help with swelling and pain control.  Icing  in a pulsatile fashion, such as 20 minutes on and 20 minutes off, can be followed.    Do not place ice directly on skin. Make sure there is a barrier between to skin and the ice pack.    Using frozen items such as frozen peas works well as the conform nicely to the are that needs to be iced.   IF YOU ARE IN A CAM BOOT (BLACK BOOT)  You may  remove boot periodically. Perform daily dressing changes as noted below.  Wash the liner of the boot regularly and wear a sock when wearing the boot. It is recommended that you sleep in the boot until told otherwise   Call office for the following:  Temperature greater than 101F  Persistent nausea and vomiting  Severe uncontrolled pain  Redness, tenderness, or signs of infection (pain, swelling, redness, odor or green/yellow discharge around the site)  Difficulty breathing, headache or visual disturbances  Hives  Persistent dizziness or light-headedness  Extreme fatigue  Any other questions or concerns you may have after discharge  In an emergency, call 911 or go to an Emergency Department at a nearby hospital  FOLLOW-UP - Please call the office to schedule a follow-up appointment in 1 week

## 2019-09-08 NOTE — Progress Notes (Signed)
Instructions on use of Walker given. Adjusted for his height. D/C instruction given and verbalized understanding. Saline lock removed.

## 2019-09-08 NOTE — Plan of Care (Signed)
  Problem: Education: Goal: Knowledge of General Education information will improve Description: Including pain rating scale, medication(s)/side effects and non-pharmacologic comfort measures 09/08/2019 1530 by Loma Sousa, RN Outcome: Adequate for Discharge 09/08/2019 0911 by Loma Sousa, RN Outcome: Progressing   Problem: Health Behavior/Discharge Planning: Goal: Ability to manage health-related needs will improve Outcome: Adequate for Discharge   Problem: Clinical Measurements: Goal: Ability to maintain clinical measurements within normal limits will improve Outcome: Adequate for Discharge Goal: Will remain free from infection Outcome: Adequate for Discharge Goal: Diagnostic test results will improve Outcome: Adequate for Discharge Goal: Respiratory complications will improve Outcome: Adequate for Discharge Goal: Cardiovascular complication will be avoided Outcome: Adequate for Discharge   Problem: Activity: Goal: Risk for activity intolerance will decrease Outcome: Adequate for Discharge   Problem: Nutrition: Goal: Adequate nutrition will be maintained Outcome: Adequate for Discharge   Problem: Coping: Goal: Level of anxiety will decrease Outcome: Adequate for Discharge   Problem: Elimination: Goal: Will not experience complications related to bowel motility Outcome: Adequate for Discharge Goal: Will not experience complications related to urinary retention Outcome: Adequate for Discharge   Problem: Pain Managment: Goal: General experience of comfort will improve 09/08/2019 1530 by Loma Sousa, RN Outcome: Adequate for Discharge 09/08/2019 0911 by Loma Sousa, RN Outcome: Progressing   Problem: Safety: Goal: Ability to remain free from injury will improve 09/08/2019 1530 by Loma Sousa, RN Outcome: Adequate for Discharge 09/08/2019 0911 by Loma Sousa, RN Outcome: Progressing   Problem: Skin Integrity: Goal: Risk for impaired  skin integrity will decrease Outcome: Adequate for Discharge   Problem: Acute Rehab OT Goals (only OT should resolve) Goal: Pt. Will Perform Upper Body Dressing Outcome: Adequate for Discharge Goal: Pt. Will Perform Lower Body Dressing Outcome: Adequate for Discharge Goal: Pt. Will Transfer To Toilet Outcome: Adequate for Discharge Goal: Pt. Will Perform Toileting-Clothing Manipulation Outcome: Adequate for Discharge Goal: Pt/Caregiver Will Perform Home Exercise Program Outcome: Adequate for Discharge   Problem: Acute Rehab PT Goals(only PT should resolve) Goal: Patient Will Transfer Sit To/From Stand Outcome: Adequate for Discharge Goal: Pt Will Ambulate Outcome: Adequate for Discharge Goal: Pt Will Go Up/Down Stairs Outcome: Adequate for Discharge

## 2019-09-08 NOTE — Plan of Care (Signed)
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
## 2019-09-09 ENCOUNTER — Encounter (HOSPITAL_BASED_OUTPATIENT_CLINIC_OR_DEPARTMENT_OTHER): Payer: Self-pay | Admitting: *Deleted

## 2019-09-15 ENCOUNTER — Ambulatory Visit: Payer: Self-pay | Attending: Physician Assistant | Admitting: Physical Therapy

## 2019-09-15 DIAGNOSIS — M25672 Stiffness of left ankle, not elsewhere classified: Secondary | ICD-10-CM | POA: Insufficient documentation

## 2019-09-15 DIAGNOSIS — M6281 Muscle weakness (generalized): Secondary | ICD-10-CM | POA: Insufficient documentation

## 2019-09-15 DIAGNOSIS — M25512 Pain in left shoulder: Secondary | ICD-10-CM | POA: Insufficient documentation

## 2019-09-15 DIAGNOSIS — R262 Difficulty in walking, not elsewhere classified: Secondary | ICD-10-CM | POA: Insufficient documentation

## 2019-09-15 DIAGNOSIS — M25572 Pain in left ankle and joints of left foot: Secondary | ICD-10-CM | POA: Insufficient documentation

## 2019-09-15 DIAGNOSIS — M25612 Stiffness of left shoulder, not elsewhere classified: Secondary | ICD-10-CM | POA: Insufficient documentation
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
## 2019-09-15 NOTE — Patient Instructions (Signed)
    Home exercise program created by Gustie Bobb, PT.  For questions, please contact Cenia Zaragosa via phone at [phone] or email at [email or web address]  [facility] [place] [address]  [address] [place], [place], [address] Phone: [phone]   Fax:  [phone]    

## 2019-09-22 ENCOUNTER — Ambulatory Visit: Payer: Self-pay | Admitting: Physical Therapy

## 2019-09-29 ENCOUNTER — Other Ambulatory Visit: Payer: Self-pay

## 2019-09-29 ENCOUNTER — Ambulatory Visit: Payer: Self-pay | Admitting: Physical Therapy

## 2019-09-29 DIAGNOSIS — M25572 Pain in left ankle and joints of left foot: Secondary | ICD-10-CM

## 2019-09-29 DIAGNOSIS — M6281 Muscle weakness (generalized): Secondary | ICD-10-CM

## 2019-09-29 DIAGNOSIS — M25672 Stiffness of left ankle, not elsewhere classified: Secondary | ICD-10-CM

## 2019-09-29 DIAGNOSIS — M25612 Stiffness of left shoulder, not elsewhere classified: Secondary | ICD-10-CM

## 2019-09-29 DIAGNOSIS — M25512 Pain in left shoulder: Secondary | ICD-10-CM

## 2019-09-29 DIAGNOSIS — R262 Difficulty in walking, not elsewhere classified: Secondary | ICD-10-CM
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
## 2019-10-13 ENCOUNTER — Ambulatory Visit: Payer: Self-pay | Attending: Physician Assistant | Admitting: Physical Therapy

## 2019-10-13 ENCOUNTER — Other Ambulatory Visit: Payer: Self-pay

## 2019-10-13 ENCOUNTER — Encounter: Payer: Self-pay | Admitting: Physical Therapy

## 2019-10-13 DIAGNOSIS — M25572 Pain in left ankle and joints of left foot: Secondary | ICD-10-CM

## 2019-10-13 DIAGNOSIS — M25612 Stiffness of left shoulder, not elsewhere classified: Secondary | ICD-10-CM

## 2019-10-13 DIAGNOSIS — M25672 Stiffness of left ankle, not elsewhere classified: Secondary | ICD-10-CM

## 2019-10-13 DIAGNOSIS — M25512 Pain in left shoulder: Secondary | ICD-10-CM

## 2019-10-13 DIAGNOSIS — R262 Difficulty in walking, not elsewhere classified: Secondary | ICD-10-CM

## 2019-10-13 DIAGNOSIS — M6281 Muscle weakness (generalized): Secondary | ICD-10-CM

## 2019-10-13 NOTE — Patient Instructions (Signed)
    Home exercise program created by Kealohilani Maiorino, PT.  For questions, please contact Yashika Mask via phone at [phone] or email at [email or web address]  [facility] [place] [address]  [address] [place], [place], [address] Phone: [phone]   Fax:  [phone]    
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
## 2019-10-20 ENCOUNTER — Other Ambulatory Visit: Payer: Self-pay

## 2019-10-20 ENCOUNTER — Ambulatory Visit: Payer: Self-pay | Admitting: Physical Therapy

## 2019-10-20 DIAGNOSIS — M25572 Pain in left ankle and joints of left foot: Secondary | ICD-10-CM

## 2019-10-20 DIAGNOSIS — M25512 Pain in left shoulder: Secondary | ICD-10-CM

## 2019-10-20 DIAGNOSIS — R262 Difficulty in walking, not elsewhere classified: Secondary | ICD-10-CM

## 2019-10-20 DIAGNOSIS — M25672 Stiffness of left ankle, not elsewhere classified: Secondary | ICD-10-CM

## 2019-10-20 DIAGNOSIS — M6281 Muscle weakness (generalized): Secondary | ICD-10-CM

## 2019-10-20 DIAGNOSIS — M25612 Stiffness of left shoulder, not elsewhere classified: Secondary | ICD-10-CM

## 2019-10-20 NOTE — Patient Instructions (Signed)
    Home exercise program created by Lavarius Doughten, PT.  For questions, please contact Kennady Zimmerle via phone at [phone] or email at [email or web address]  [facility] [address]  [address] [place], [place], [address] Phone: [phone]   Fax:  [phone]    
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
## 2019-10-27 ENCOUNTER — Ambulatory Visit: Payer: Self-pay | Admitting: Physical Therapy

## 2019-11-16 ENCOUNTER — Ambulatory Visit: Payer: Self-pay | Admitting: Physical Therapy

## 2019-11-25 ENCOUNTER — Ambulatory Visit: Payer: Self-pay | Admitting: Physical Therapy
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
## 2023-03-19 ENCOUNTER — Emergency Department (HOSPITAL_BASED_OUTPATIENT_CLINIC_OR_DEPARTMENT_OTHER)
Admission: EM | Admit: 2023-03-19 | Discharge: 2023-03-19 | Disposition: A | Discharge status: Home / Self Care | Payer: Medicaid Other | Attending: Emergency Medicine | Admitting: Emergency Medicine

## 2023-03-19 ENCOUNTER — Other Ambulatory Visit: Payer: Self-pay

## 2023-03-19 ENCOUNTER — Encounter (HOSPITAL_BASED_OUTPATIENT_CLINIC_OR_DEPARTMENT_OTHER): Payer: Self-pay | Admitting: Radiology

## 2023-03-19 DIAGNOSIS — K029 Dental caries, unspecified: Secondary | ICD-10-CM | POA: Diagnosis not present

## 2023-03-19 DIAGNOSIS — K0889 Other specified disorders of teeth and supporting structures: Secondary | ICD-10-CM | POA: Diagnosis present

## 2023-03-19 MED ORDER — LIDOCAINE VISCOUS HCL 2 % MT SOLN: 15.0000 mL | OROMUCOSAL | 0 refills | Status: AC | PRN

## 2023-03-19 MED ORDER — LIDOCAINE VISCOUS HCL 2 % MT SOLN
15.0000 mL | Freq: Once | OROMUCOSAL | Status: AC
  Administered 2023-03-19: 15 mL via OROMUCOSAL
  Filled 2023-03-19: qty 15

## 2023-03-19 MED ORDER — PENICILLIN V POTASSIUM 500 MG PO TABS: 500.0000 mg | ORAL_TABLET | Freq: Four times a day (QID) | ORAL | 0 refills | Status: AC

## 2023-03-19 MED ORDER — HYDROCODONE-ACETAMINOPHEN 5-325 MG PO TABS: 1.0000 | ORAL_TABLET | Freq: Four times a day (QID) | ORAL | 0 refills | Status: AC | PRN

## 2023-03-19 NOTE — Discharge Instructions (Addendum)
 You have been seen today for your complaint of dental pain. Your discharge medications include vicodin. This is an opioid pain medication. You should only take this medication as needed for severe pain. You should not drive, operate heavy machinery or make important decisions while taking this medication. You should use alternative methods for pain relief while taking this medication including stretching, gentle range of motion, and alternating tylenol  and ibuprofen . ibuprofen .  You may take up to 100 mg every 8 hours.  Tylenol .  You may take up to 4000 mg total per day.  He may take up to 1000 mg at a time.  Know that the Vicodin also contains Tylenol  so be mindful of how much extra Tylenol  you are taking. Lidocaine .  This is a mouthwash used to help numb your teeth.  Use this as needed. Penicillin . This is an antibiotic. You should take it as prescribed. You should take it for the entire duration of the prescription. This may cause an upset stomach. This is normal. You may take this with food. You may also eat yogurt to prevent diarrhea. Follow up with: A dentist as soon as possible.  I have included a resource guide for you Please seek immediate medical care if you develop any of the following symptoms: You cannot open your mouth. You are having trouble breathing or swallowing. You have a fever. Your face, neck, or jaw is swollen. At this time there does not appear to be the presence of an emergent medical condition, however there is always the potential for conditions to change. Please read and follow the below instructions.  Do not take your medicine if  develop an itchy rash, swelling in your mouth or lips, or difficulty breathing; call 911 and seek immediate emergency medical attention if this occurs.  You may review your lab tests and imaging results in their entirety on your MyChart account.  Please discuss all results of fully with your primary care provider and other specialist at your  follow-up visit.  Note: Portions of this text may have been transcribed using voice recognition software. Every effort was made to ensure accuracy; however, inadvertent computerized transcription errors may still be present.

## 2023-03-19 NOTE — ED Provider Notes (Signed)
 [place] [facility] Provider Note   CSN: [id number] Arrival date & time: 03/19/23  1953     History  Chief Complaint  Patient presents with   Dental Pain    Kevin Villarreal is a 28 y.o. male.  Presenting to the ED for evaluation of dental pain.  Pain is localized to bilateral upper and lower molars, worse in the right lower molar.  States he has developed fractures and dental caries of all of these molars over time.  Pain is constant but worse with chewing.  He has been applying Orajel and using Tylenol  with minimal improvement in his symptoms.  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  He has been calling dentists in the area and reports that most of them typically do not accept Medicaid and he did not have soon enough appointments.  He denies any sore throat or difficulty swallowing.  No fevers or chills.   Dental Pain      Home Medications Prior to Admission medications   Medication Sig Start Date End Date Taking? Authorizing Provider  HYDROcodone -acetaminophen  (NORCO/VICODIN) 5-325 MG tablet Take 1 tablet by mouth every 6 (six) hours as needed. 03/19/23  Yes Ruperto Kiernan, Marsa HERO, PA-C  lidocaine  (XYLOCAINE ) 2 % solution Use as directed 15 mLs in the mouth or throat as needed for mouth pain. 03/19/23  Yes Tavon Corriher, Marsa HERO, PA-C  penicillin  v potassium (VEETID) 500 MG tablet Take 1 tablet (500 mg total) by mouth 4 (four) times daily for 7 days. 03/19/23 03/26/23 Yes Menno Vanbergen, Marsa HERO, PA-C  cyclobenzaprine  (FLEXERIL ) 10 MG tablet Take 1 tablet (10 mg total) by mouth 2 (two) times daily as needed for muscle spasms. Patient not taking: Reported on 09/15/2019 06/27/16   Layden, Lindsey A, PA-C  naproxen  (NAPROSYN ) 500 MG tablet Take 1 tablet (500 mg total) by mouth 2 (two) times daily. Patient not taking: Reported on 09/15/2019 06/27/16   Layden, Lindsey A, PA-C      Allergies    Patient has no known  allergies.    Review of Systems   Review of Systems  HENT:  Positive for dental problem.   All other systems reviewed and are negative.   Physical Exam Updated Vital Signs BP 126/83   Pulse 69   Temp 98.2 F (36.8 C) (Oral)   Ht 5' 6 (1.676 m)   Wt 72.1 kg   SpO2 100%   BMI 25.66 kg/m  Physical Exam Vitals and nursing note reviewed.  Constitutional:      General: He is not in acute distress.    Appearance: Normal appearance. He is normal weight. He is not ill-appearing.  HENT:     Head: Normocephalic and atraumatic.     Mouth/Throat:     Comments: Dental caries, erosions and fractures of bilateral upper and lower molars.  No surrounding erythema.  No purulent drainage.  No drooling, trismus or tripoding.  Tonsils 0 bilaterally.  Uvula midline.  No submandibular induration. Pulmonary:     Effort: Pulmonary effort is normal. No respiratory distress.  Abdominal:     General: Abdomen is flat.  Musculoskeletal:        General: Normal range of motion.     Cervical back: Neck supple.  Skin:    General: Skin is warm and dry.  Neurological:     Mental Status: He is alert and oriented to person, place, and time.  Psychiatric:  Mood and Affect: Mood normal.        Behavior: Behavior normal.     ED Results / Procedures / Treatments   Labs (all labs ordered are listed, but only abnormal results are displayed) Labs Reviewed - No data to display  EKG None  Radiology No results found.  Procedures Procedures    Medications Ordered in ED Medications  lidocaine  (XYLOCAINE ) 2 % viscous mouth solution 15 mL (has no administration in time range)    ED Course/ Medical Decision Making/ A&P                                 Medical Decision Making This patient presents to the ED for concern of dental pain, this involves an extensive number of treatment options.  The differential diagnosis includes pulpitis, pain due to dental caries, periapical or other periodontal  abscess  My initial workup includes pain control  Additional history obtained from: Nursing notes from this visit.  Afebrile, hemodynamically stable.  28 year old male presenting to the ED for evaluation of dental pain.  He has dental fractures and erosions of bilateral upper and lower molars on exam.  Significant tenderness to palpation.  Will treat for potential dental infection with penicillin .  Will also treat pain with very short course of Vicodin.  He was educated on potential side effects.  He was sent a prescription for viscous lidocaine  as well for his dental pain.  He was given a facilities manager for dentists in the area.  Overall lower suspicion for Ludwig's angina or abscess.  He was given return precautions.  Stable at discharge.  At this time there does not appear to be any evidence of an acute emergency medical condition and the patient appears stable for discharge with appropriate outpatient follow up. Diagnosis was discussed with patient who verbalizes understanding of care plan and is agreeable to discharge. I have discussed return precautions with patient who verbalizes understanding. Patient encouraged to follow-up with a dentist as soon as possible. All questions answered.  Note: Portions of this report may have been transcribed using voice recognition software. Every effort was made to ensure accuracy; however, inadvertent computerized transcription errors may still be present.         Final Clinical Impression(s) / ED Diagnoses Final diagnoses:  Pain due to dental caries    Rx / DC Orders ED Discharge Orders          Ordered    lidocaine  (XYLOCAINE ) 2 % solution  As needed        03/19/23 2050    HYDROcodone -acetaminophen  (NORCO/VICODIN) 5-325 MG tablet  Every 6 hours PRN        03/19/23 2050    penicillin  v potassium (VEETID) 500 MG tablet  4 times daily        03/19/23 2050              [name] 03/19/23 2055    Dreama Longs,  MD 03/20/23 1429

## 2023-03-19 NOTE — ED Notes (Signed)
 Discharge paperwork reviewed entirely with patient, including follow up care. Pain was under control. The patient received instruction and coaching on their prescriptions, and all follow-up questions were answered.  Pt verbalized understanding as well as all parties involved. No questions or concerns voiced at the time of discharge. No acute distress noted.   Pt ambulated out to PVA without incident or assistance.  Pt advised they will notify their PCP immediately. and Pt advised they will seek followup care with a specialist and followup with their PCP.

## 2023-03-19 NOTE — ED Triage Notes (Signed)
 Pt states that his wisdom teeth are broken and he is having severe pain. Pt states he has medicaid and can't find anyone that will see him.

## 2023-05-01 ENCOUNTER — Encounter (HOSPITAL_BASED_OUTPATIENT_CLINIC_OR_DEPARTMENT_OTHER): Payer: Self-pay | Admitting: Emergency Medicine

## 2023-05-01 ENCOUNTER — Other Ambulatory Visit: Payer: Self-pay

## 2023-05-01 ENCOUNTER — Emergency Department (HOSPITAL_BASED_OUTPATIENT_CLINIC_OR_DEPARTMENT_OTHER)
Admission: EM | Admit: 2023-05-01 | Discharge: 2023-05-01 | Disposition: A | Discharge status: Home / Self Care | Payer: Medicaid Other | Attending: Emergency Medicine | Admitting: Emergency Medicine

## 2023-05-01 ENCOUNTER — Emergency Department (HOSPITAL_BASED_OUTPATIENT_CLINIC_OR_DEPARTMENT_OTHER): Payer: Medicaid Other

## 2023-05-01 DIAGNOSIS — X501XXA Overexertion from prolonged static or awkward postures, initial encounter: Secondary | ICD-10-CM | POA: Diagnosis not present

## 2023-05-01 DIAGNOSIS — M25511 Pain in right shoulder: Secondary | ICD-10-CM | POA: Diagnosis present

## 2023-05-01 MED ORDER — METHYLPREDNISOLONE 4 MG PO TBPK: ORAL_TABLET | ORAL | 0 refills | Status: AC

## 2023-05-01 NOTE — ED Provider Notes (Signed)
 [place] [facility] Provider Note   CSN: [id number] Arrival date & time: 05/01/23  1214     History  Chief Complaint  Patient presents with   Shoulder Pain    Kevin Villarreal is a 28 y.o. male who presents to the ER complaining of right shoulder pain for the past 5 days.  Patient states that he had went bowling the night before and then fell asleep on the couch.  When he woke up early the next morning he had pretty severe pain in his right shoulder.  He was trying to rotate/stretch it and felt that it was making it worse, he was hearing lots of "pops".  Has history of fracture of the left shoulder, no prior problems with the right.  He works as a Naval architect, frequently Environmental education officer.   Shoulder Pain      Home Medications Prior to Admission medications   Medication Sig Start Date End Date Taking? Authorizing Provider  methylPREDNISolone (MEDROL DOSEPAK) 4 MG TBPK tablet Take per package instructions 05/01/23  Yes Elisah Parmer T, PA-C  cyclobenzaprine (FLEXERIL) 10 MG tablet Take 1 tablet (10 mg total) by mouth 2 (two) times daily as needed for muscle spasms. Patient not taking: Reported on 09/15/2019 06/27/16   Maxwell Caul, PA-C  HYDROcodone-acetaminophen (NORCO/VICODIN) 5-325 MG tablet Take 1 tablet by mouth every 6 (six) hours as needed. 03/19/23   Schutt, Edsel Petrin, PA-C  lidocaine (XYLOCAINE) 2 % solution Use as directed 15 mLs in the mouth or throat as needed for mouth pain. 03/19/23   Schutt, Edsel Petrin, PA-C  naproxen (NAPROSYN) 500 MG tablet Take 1 tablet (500 mg total) by mouth 2 (two) times daily. Patient not taking: Reported on 09/15/2019 06/27/16   Maxwell Caul, PA-C      Allergies    Patient has no known allergies.    Review of Systems   Review of Systems  Musculoskeletal:  Positive for arthralgias.  All other systems reviewed and are negative.   Physical Exam Updated Vital Signs BP 124/79 (BP  Location: Left Arm)   Pulse 77   Temp 98.1 F (36.7 C) (Oral)   Resp 18   Ht 6' (1.829 m)   Wt 75.8 kg   SpO2 98%   BMI 22.65 kg/m  Physical Exam Vitals and nursing note reviewed.  Constitutional:      Appearance: Normal appearance.  HENT:     Head: Normocephalic and atraumatic.  Eyes:     Conjunctiva/sclera: Conjunctivae normal.  Pulmonary:     Effort: Pulmonary effort is normal. No respiratory distress.  Musculoskeletal:     Comments: Some tenderness to palpation over the right AC joint, and positive empty can test on the right.  However no focal bony abnormalities.  Preserve range of motion, but painful at the extremes.  Normal sensation.  Skin:    General: Skin is warm and dry.  Neurological:     Mental Status: He is alert.  Psychiatric:        Mood and Affect: Mood normal.        Behavior: Behavior normal.     ED Results / Procedures / Treatments   Labs (all labs ordered are listed, but only abnormal results are displayed) Labs Reviewed - No data to display  EKG None  Radiology DG Shoulder Right Result Date: 05/01/2023 CLINICAL DATA:  Right shoulder pain and limited range of motion since Saturday. No injury. EXAM: RIGHT SHOULDER - 2+ VIEW  COMPARISON:  Right shoulder x-rays dated June 27, 2016. FINDINGS: There is no evidence of fracture or dislocation. There is no evidence of arthropathy or other focal bone abnormality. Soft tissues are unremarkable. IMPRESSION: Negative. Electronically Signed   By: Obie Dredge M.D.   On: 05/01/2023 14:20    Procedures Procedures    Medications Ordered in ED Medications - No data to display  ED Course/ Medical Decision Making/ A&P                                 Medical Decision Making Amount and/or Complexity of Data Reviewed Radiology: ordered.   This patient is a 27 y.o. male  who presents to the ED for concern of R shoulder pain.   Differential diagnoses prior to evaluation: The emergent differential  diagnosis includes, but is not limited to, fracture, dislocation, ligamentous injury, tendinitis. This is not an exhaustive differential.   Past Medical History / Co-morbidities / Social History: No significant past medical history  Physical Exam: Physical exam performed. The pertinent findings include: Normal vitals, no acute distress.  Some tenderness over the right AC joint, positive empty can test on the right.  Suspicious for right rotator cuff tendinitis.  Neurovascularly intact.  Lab Tests/Imaging studies: I personally interpreted labs/imaging and the pertinent results include: X-ray of the right shoulder without acute abnormalities. I agree with the radiologist interpretation.  Disposition: After consideration of the diagnostic results and the patients response to treatment, I feel that emergency department workup does not suggest an emergent condition requiring admission or immediate intervention beyond what has been performed at this time. The plan is: Discharge to home.  Will give Medrol Dosepak.  Will recommend follow-up with orthopedist.  Offered patient a sling, and we agreed it may not give him much benefit.  Encouraged gentle movement.. The patient is safe for discharge and has been instructed to return immediately for worsening symptoms, change in symptoms or any other concerns.  Final Clinical Impression(s) / ED Diagnoses Final diagnoses:  Acute pain of right shoulder    Rx / DC Orders ED Discharge Orders          Ordered    methylPREDNISolone (MEDROL DOSEPAK) 4 MG TBPK tablet        05/01/23 1438           Portions of this report may have been transcribed using voice recognition software. Every effort was made to ensure accuracy; however, inadvertent computerized transcription errors may be present.    Jeanella Flattery 05/01/23 1442    Arby Barrette, MD 05/02/23 [phone]

## 2023-05-01 NOTE — Discharge Instructions (Addendum)
 You were seen in the ER for shoulder pain.  As we discussed your x-ray did not show any broken or dislocated bones.  I suspect that you likely have tendinitis of the soft tissue in your right shoulder.  This could be related to your rotator cuff.  I am giving you dose of steroids to help with some of the inflammation.  You can also take Motrin or Tylenol as needed for pain.  I attached the contact information for the orthopedist for you to call and make a follow-up appointment.  Their same practice also has an urgent care, and I recommend looking into this if you need.

## 2023-05-01 NOTE — ED Triage Notes (Signed)
 Pt c/o RT shoulder pain since Sat; no injury, but went bowling and then "slept on it wrong" on the couch

## 2023-09-15 ENCOUNTER — Encounter (HOSPITAL_BASED_OUTPATIENT_CLINIC_OR_DEPARTMENT_OTHER): Payer: Self-pay | Admitting: Emergency Medicine

## 2023-09-15 ENCOUNTER — Emergency Department (HOSPITAL_BASED_OUTPATIENT_CLINIC_OR_DEPARTMENT_OTHER)

## 2023-09-15 ENCOUNTER — Emergency Department (HOSPITAL_BASED_OUTPATIENT_CLINIC_OR_DEPARTMENT_OTHER)
Admission: EM | Admit: 2023-09-15 | Discharge: 2023-09-15 | Disposition: A | Discharge status: Home / Self Care | Attending: Emergency Medicine | Admitting: Emergency Medicine

## 2023-09-15 ENCOUNTER — Other Ambulatory Visit: Payer: Self-pay

## 2023-09-15 DIAGNOSIS — Y9383 Activity, rough housing and horseplay: Secondary | ICD-10-CM | POA: Insufficient documentation

## 2023-09-15 DIAGNOSIS — W208XXA Other cause of strike by thrown, projected or falling object, initial encounter: Secondary | ICD-10-CM | POA: Insufficient documentation

## 2023-09-15 DIAGNOSIS — M25571 Pain in right ankle and joints of right foot: Secondary | ICD-10-CM | POA: Diagnosis not present

## 2023-09-15 DIAGNOSIS — S99921A Unspecified injury of right foot, initial encounter: Secondary | ICD-10-CM | POA: Diagnosis present

## 2023-09-15 DIAGNOSIS — S0081XA Abrasion of other part of head, initial encounter: Secondary | ICD-10-CM | POA: Diagnosis not present

## 2023-09-15 DIAGNOSIS — S93601A Unspecified sprain of right foot, initial encounter: Secondary | ICD-10-CM | POA: Diagnosis not present

## 2023-09-15 MED ORDER — IBUPROFEN 600 MG PO TABS: 600.0000 mg | ORAL_TABLET | Freq: Four times a day (QID) | ORAL | 0 refills | Status: AC | PRN

## 2023-09-15 MED ORDER — IBUPROFEN 800 MG PO TABS
800.0000 mg | ORAL_TABLET | Freq: Once | ORAL | Status: AC
  Administered 2023-09-15: 800 mg via ORAL
  Filled 2023-09-15: qty 1

## 2023-09-15 NOTE — ED Provider Notes (Signed)
  [facility] [place] Provider Note   CSN: [id number] Arrival date & time: 09/15/23  9884     Patient presents with: Ankle Pain   Kevin Villarreal is a 28 y.o. male.   The history is provided by the patient.  Ankle Pain [name] is a 28 y.o. male who presents to the Emergency Department complaining of ankle pain.  He presents emergency department for evaluation of pain to his right foot and ankle after he injured it around 8 PM.  He is not exactly clear on what occurred because he was roughhousing with his children.  He states that he had mild pain initially but now he has pain and inability to bear weight.  He has injured this foot previously.  No known medical problems or routine medications.     Prior to Admission medications   Medication Sig Start Date End Date Taking? Authorizing Provider  ibuprofen  (ADVIL ) 600 MG tablet Take 1 tablet (600 mg total) by mouth every 6 (six) hours as needed. 09/15/23  Yes Griselda Norris, MD  cyclobenzaprine  (FLEXERIL ) 10 MG tablet Take 1 tablet (10 mg total) by mouth 2 (two) times daily as needed for muscle spasms. Patient not taking: Reported on 09/15/2019 06/27/16   Layden, Lindsey A, PA-C  HYDROcodone -acetaminophen  (NORCO/VICODIN) 5-325 MG tablet Take 1 tablet by mouth every 6 (six) hours as needed. 03/19/23   Schutt, Marsa HERO, PA-C  lidocaine  (XYLOCAINE ) 2 % solution Use as directed 15 mLs in the mouth or throat as needed for mouth pain. 03/19/23   Schutt, Marsa HERO, PA-C  methylPREDNISolone  (MEDROL  DOSEPAK) 4 MG TBPK tablet Take per package instructions 05/01/23   Roemhildt, Lorin T, PA-C  naproxen  (NAPROSYN ) 500 MG tablet Take 1 tablet (500 mg total) by mouth 2 (two) times daily. Patient not taking: Reported on 09/15/2019 06/27/16   Layden, Lindsey A, PA-C    Allergies: Patient has no known allergies.    Review of Systems  All other systems reviewed and are negative.   Updated Vital Signs BP  109/63   Pulse 71   Temp 98.2 F (36.8 C) (Oral)   Resp 18   SpO2 99%   Physical Exam Vitals and nursing note reviewed.  Constitutional:      Appearance: Normal appearance.  HENT:     Head: Normocephalic.     Comments: Abrasion over left cheek Cardiovascular:     Rate and Rhythm: Normal rate and regular rhythm.  Pulmonary:     Effort: Pulmonary effort is normal. No respiratory distress.  Musculoskeletal:     Comments: 2+ DP pulses bilaterally.  There is mild soft tissue swelling over the right midfoot, mild tenderness over the right lateral midfoot.  He is able to flex and extend at the ankle with no significant angle tenderness, wiggles toes.  Sensation light touch intact in the foot.  Neurological:     Mental Status: He is alert.     (all labs ordered are listed, but only abnormal results are displayed) Labs Reviewed - No data to display  EKG: None  Radiology: DG Foot Complete Right Result Date: 09/15/2023 CLINICAL DATA:  Right foot injury. EXAM: RIGHT FOOT COMPLETE - 3+ VIEW COMPARISON:  None Available. FINDINGS: Three views. There is no evidence of fracture or dislocation. There is no evidence of arthropathy or other focal bone abnormality. There is mild swelling in the forefoot. IMPRESSION: Soft tissue swelling without evidence of fractures. Electronically Signed   By: Francis Beatriz HERO.D.  On: 09/15/2023 02:20   DG Ankle Complete Right Result Date: 09/15/2023 CLINICAL DATA:  Blunt trauma to the right ankle with pain, initial encounter EXAM: RIGHT ANKLE - COMPLETE 3+ VIEW COMPARISON:  None Available. FINDINGS: There is no evidence of fracture, dislocation, or joint effusion. There is no evidence of arthropathy or other focal bone abnormality. Soft tissues are unremarkable. IMPRESSION: No acute abnormality noted. Electronically Signed   By: Oneil Devonshire M.D.   On: 09/15/2023 01:46     Procedures   Medications Ordered in the ED  ibuprofen  (ADVIL ) tablet 800 mg (800 mg  Oral Given 09/15/23 0205)                                    Medical Decision Making Amount and/or Complexity of Data Reviewed Radiology: ordered.  Risk Prescription drug management.   Patient here for evaluation of right foot pain after a injury while roughhousing, not clear exact mechanism.  He does have mild soft tissue swelling and tenderness on examination.  No overlying abrasions or wounds to the foot.  He is well-perfused.  Plain films are negative for acute fracture or dislocation.  Will provide crutches for weightbearing as tolerated.  Discussed NSAIDs for pain-will provide prescription.  Discussed he may add acetaminophen  for pain control.  Discussed orthopedics follow-up if he has ongoing pain and return precautions.     Final diagnoses:  Sprain of right foot, initial encounter    ED Discharge Orders          Ordered    ibuprofen  (ADVIL ) 600 MG tablet  Every 6 hours PRN        09/15/23 0226               Griselda Norris, MD 09/15/23 0321

## 2023-09-15 NOTE — ED Triage Notes (Signed)
 Pt reports right ankle pain that began tonight after a night stand fell onto his foot. Reports ice hasn't been helping.

## 2023-09-15 NOTE — ED Notes (Signed)
 Sharp pain in foot when applying pressure
